# Patient Record
Sex: Female | Born: 1970 | Race: Black or African American | Hispanic: No | Marital: Married | Smoking: Never smoker
Health system: Southern US, Community
[De-identification: ages and names within clinical notes are randomized; demographics above are authoritative.]

## PROBLEM LIST (undated history)

## (undated) DIAGNOSIS — E669 Obesity, unspecified: Secondary | ICD-10-CM

## (undated) HISTORY — PX: DILATION AND CURETTAGE OF UTERUS: SHX78

## (undated) HISTORY — PX: APPENDECTOMY: SHX54

## (undated) HISTORY — PX: ABDOMINAL HYSTERECTOMY: SHX81

---

## 2002-01-14 ENCOUNTER — Encounter: Payer: Self-pay | Admitting: *Deleted

## 2002-01-14 ENCOUNTER — Inpatient Hospital Stay (HOSPITAL_COMMUNITY): Admission: AD | Admit: 2002-01-14 | Discharge: 2002-01-26 | Payer: Self-pay | Admitting: *Deleted

## 2002-02-10 ENCOUNTER — Inpatient Hospital Stay (HOSPITAL_COMMUNITY): Admission: AD | Admit: 2002-02-10 | Discharge: 2002-02-10 | Payer: Self-pay | Admitting: *Deleted

## 2002-03-10 ENCOUNTER — Inpatient Hospital Stay (HOSPITAL_COMMUNITY): Admission: AD | Admit: 2002-03-10 | Discharge: 2002-03-10 | Payer: Self-pay | Admitting: *Deleted

## 2011-05-14 ENCOUNTER — Ambulatory Visit: Payer: Self-pay | Admitting: Internal Medicine

## 2011-05-14 ENCOUNTER — Observation Stay: Payer: Self-pay | Admitting: Surgery

## 2011-05-20 LAB — PATHOLOGY REPORT

## 2011-12-10 IMAGING — CR DG ABDOMEN 2V
1 series · 2 of 2 positions shown · non-contrast
Comparison: none

REASON FOR EXAM: Abdominal pain for 3 days.
COMMENTS:

PROCEDURE:     MDR - MDR ABDOMEN 2V FLAT AND ERECT  - May 14, 2011 [DATE]
RESULT:     View the abdomen demonstrates an unremarkable bowel gas pattern.
The lung bases are clear. The bony structures are unremarkable.

[Series 1: view not recorded · 0.17mm/px · 2 of 2 slices shown]
[im 1/2]
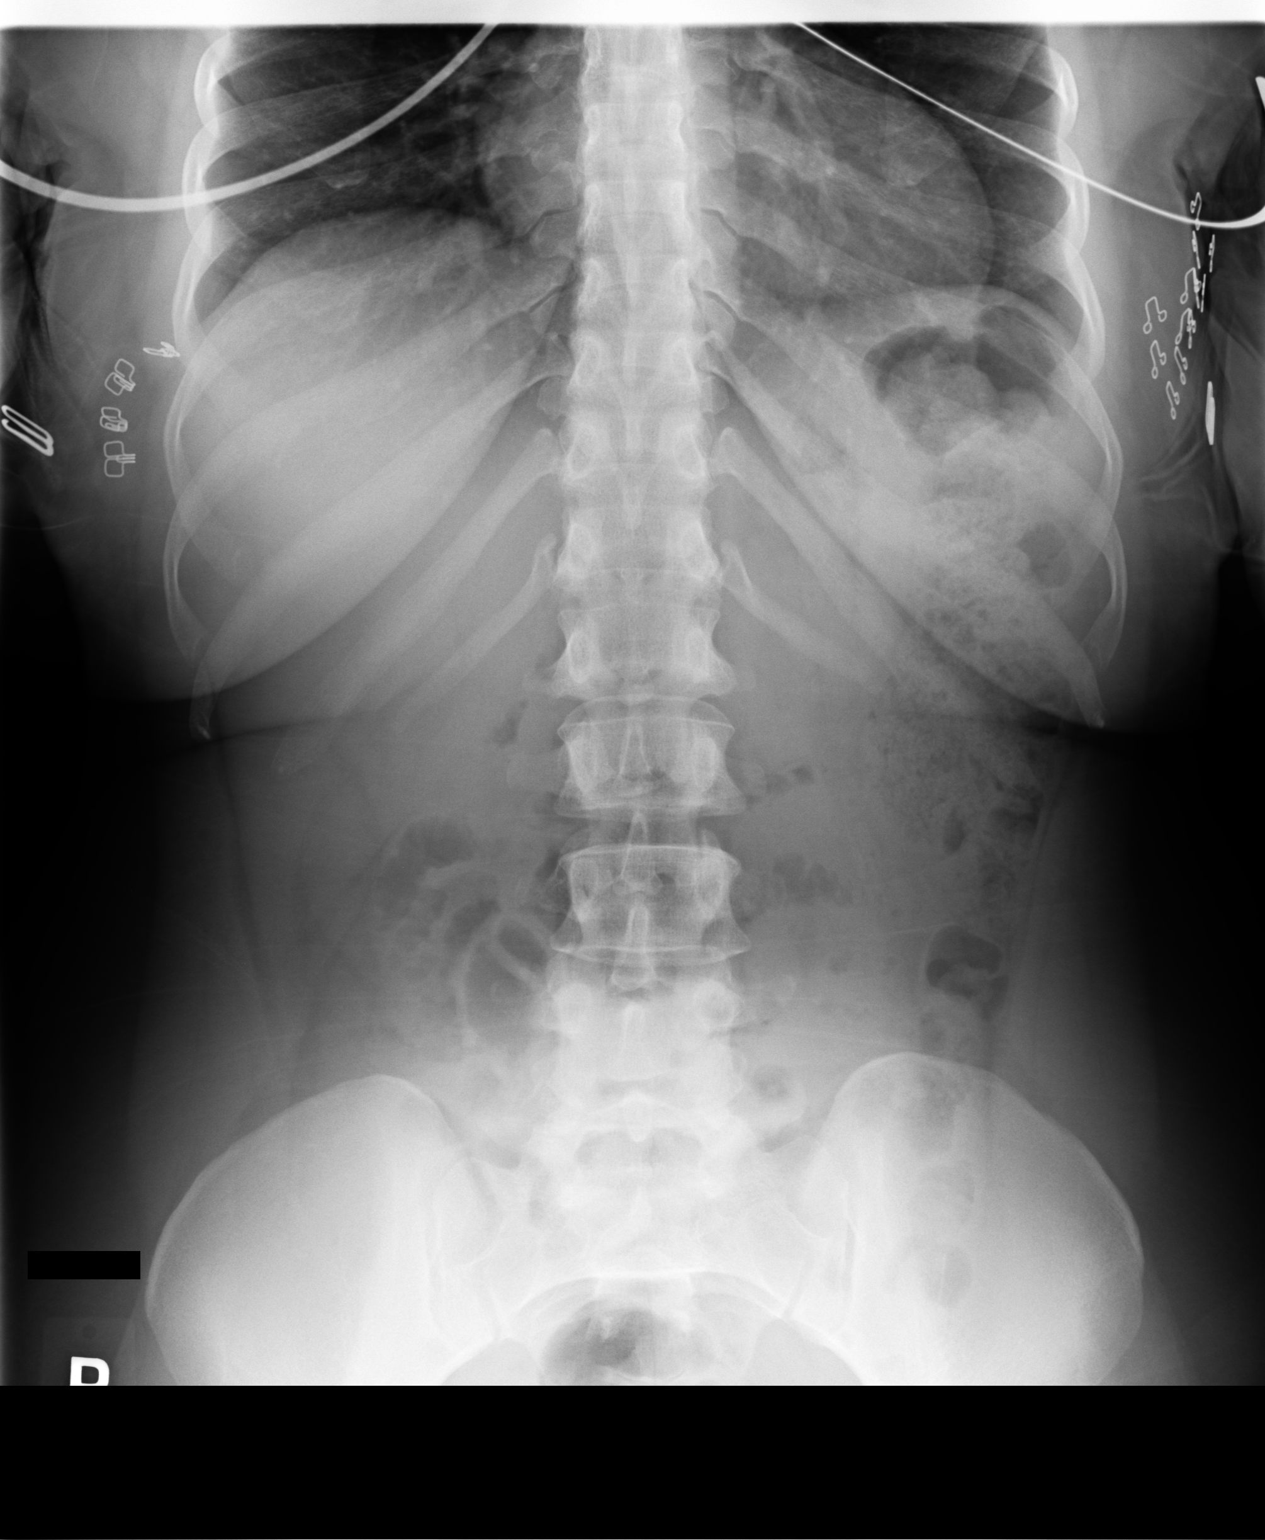
[im 2/2]
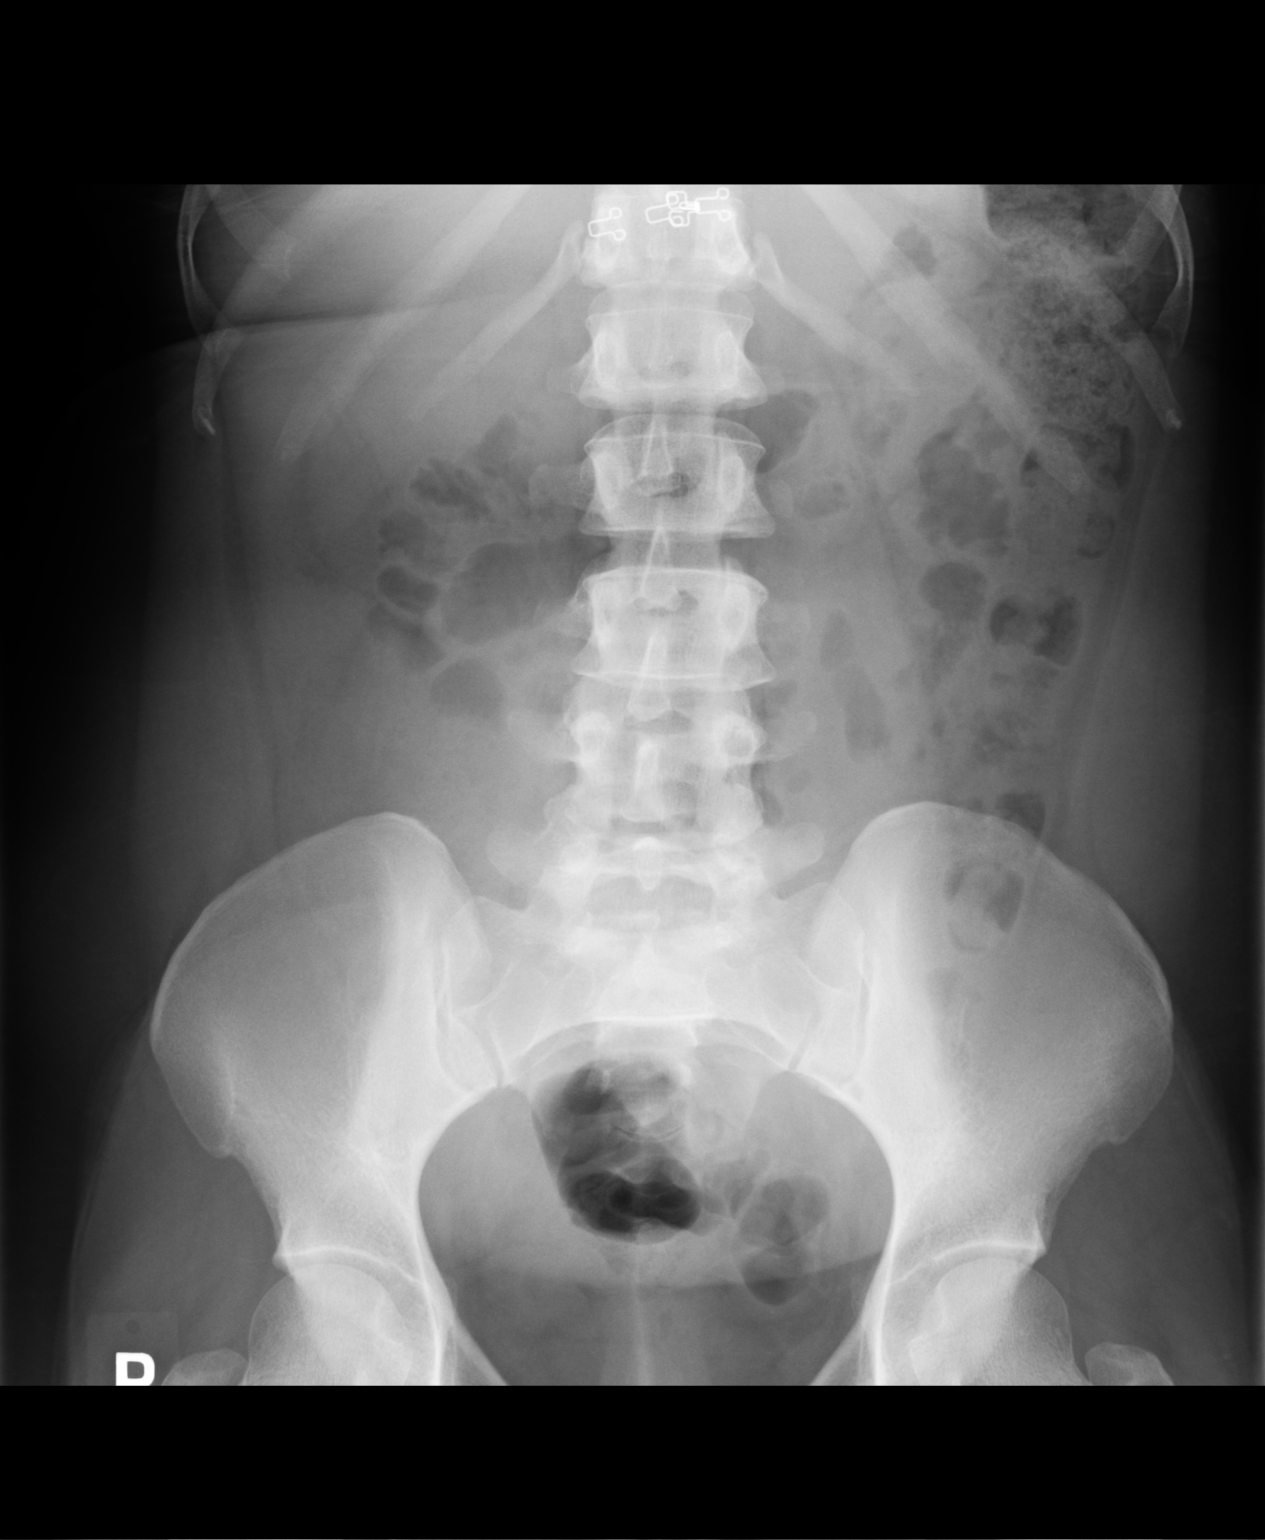

[2 of 2 positions shown; findings below may reference images not displayed]

IMPRESSION: No evidence of bowel obstruction or perforation.

## 2014-01-29 ENCOUNTER — Ambulatory Visit: Payer: Self-pay | Admitting: Family Medicine

## 2014-01-29 LAB — PREGNANCY, URINE: Pregnancy Test, Urine: NEGATIVE m[IU]/mL

## 2014-08-27 IMAGING — CR DG ABDOMEN 3V
1 series · 3 of 3 positions shown · non-contrast
Comparison: CT abdomen pelvis of 05/14/2011 and abdomen plain films
of the same day

CLINICAL DATA: Severe paraumbilical pain over the last 3 weeks,
history of prior appendectomy

EXAM:
ABDOMEN SERIES

[Series 1: pa · 0.17mm/px · 3 of 3 slices shown]
[im 1/3]
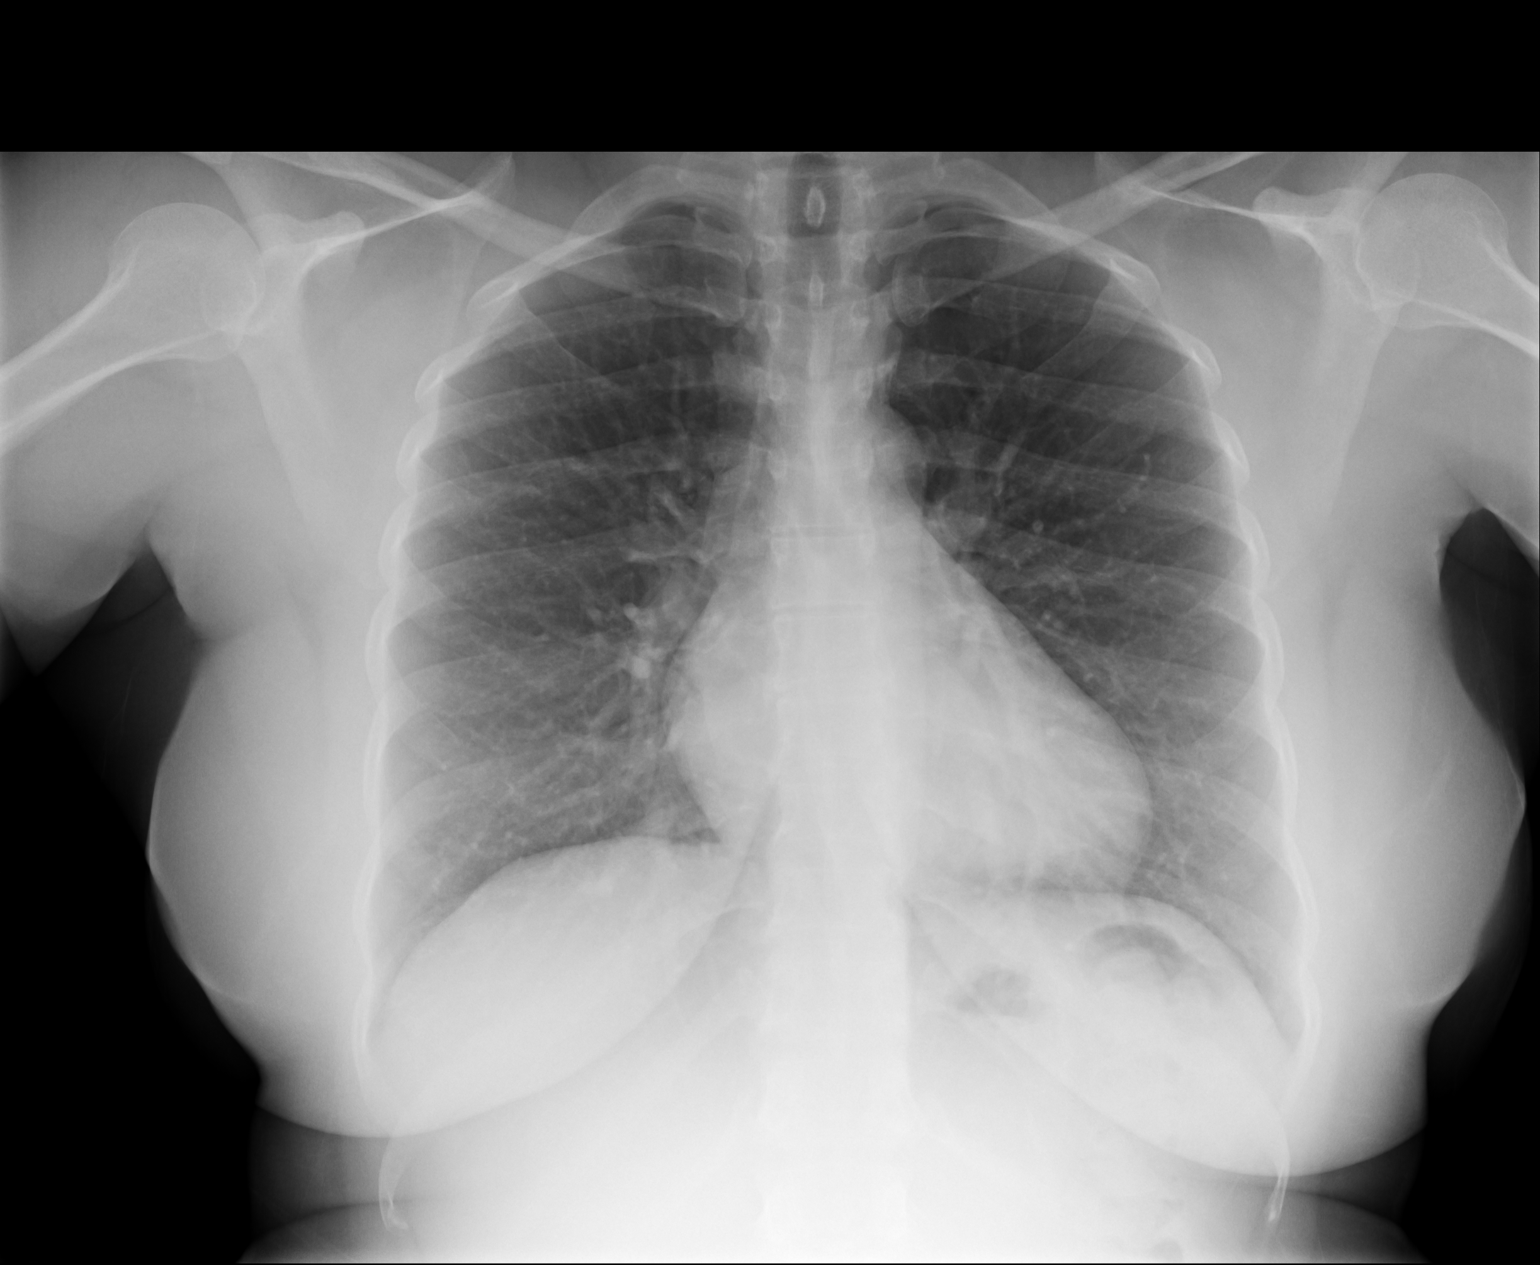
[im 2/3]
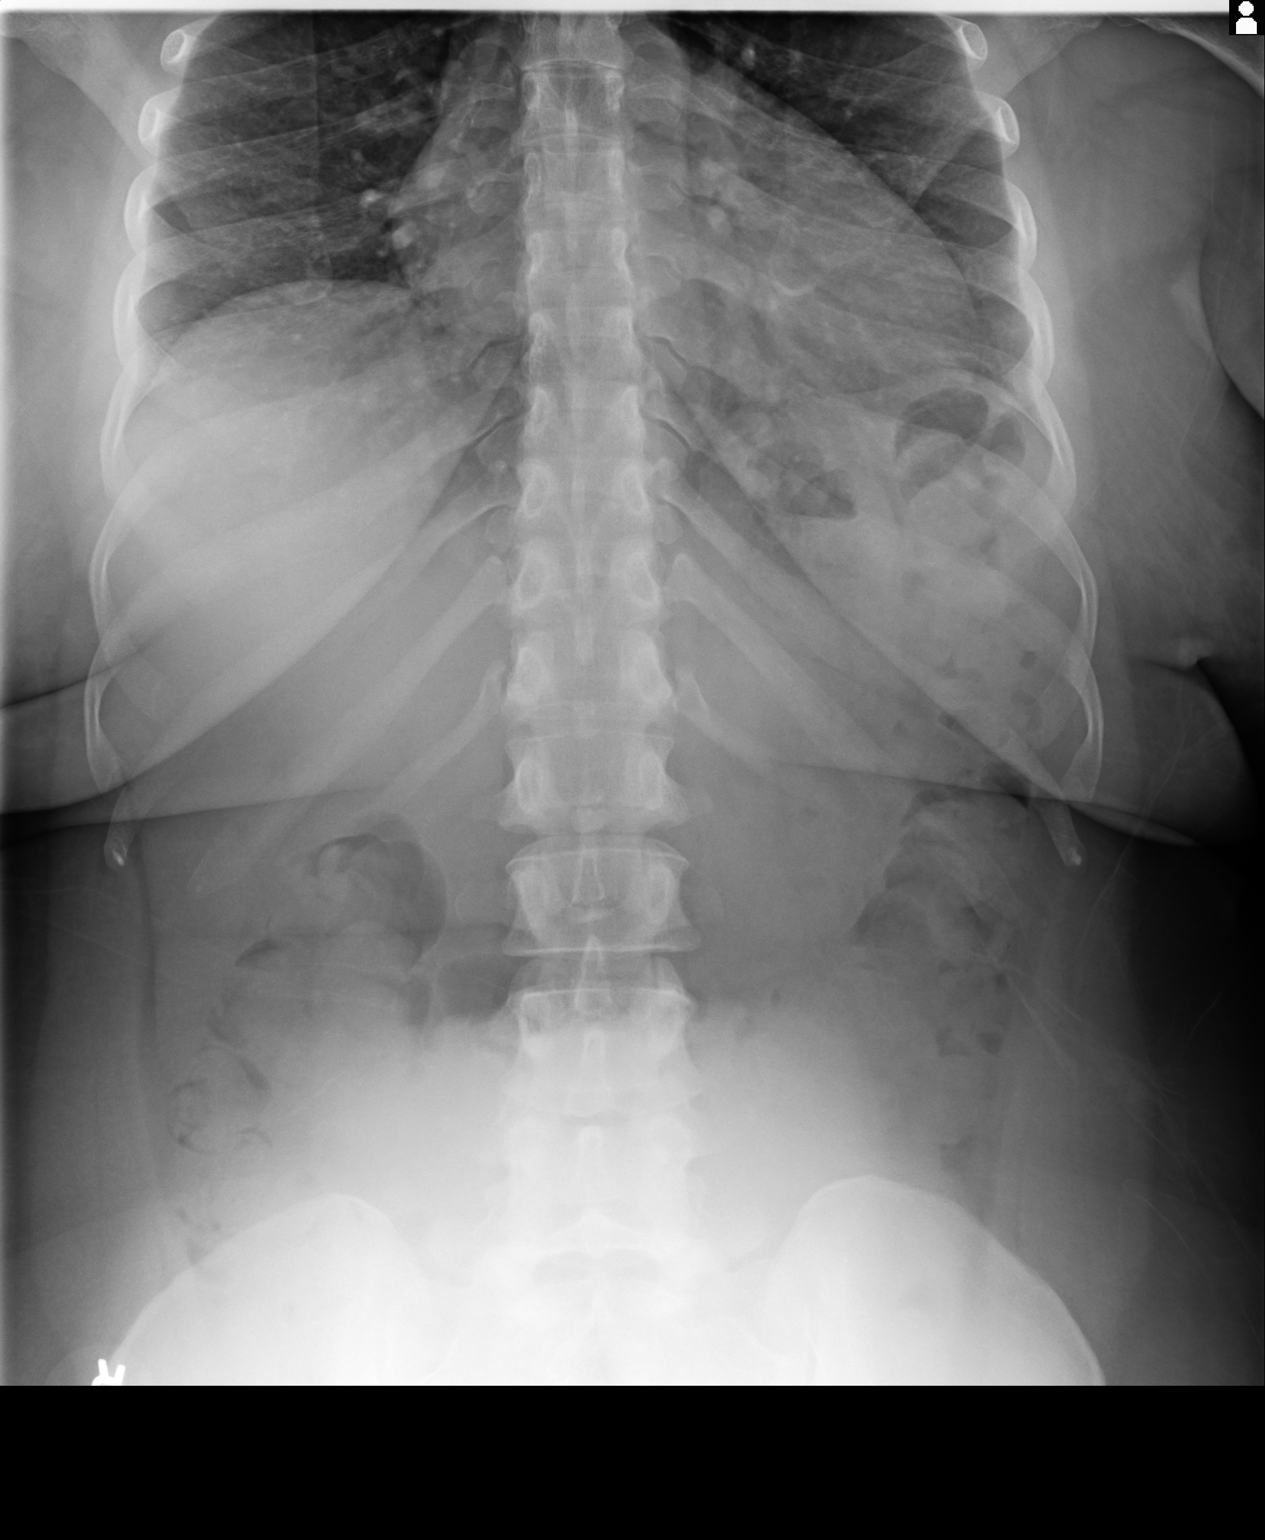
[im 3/3]
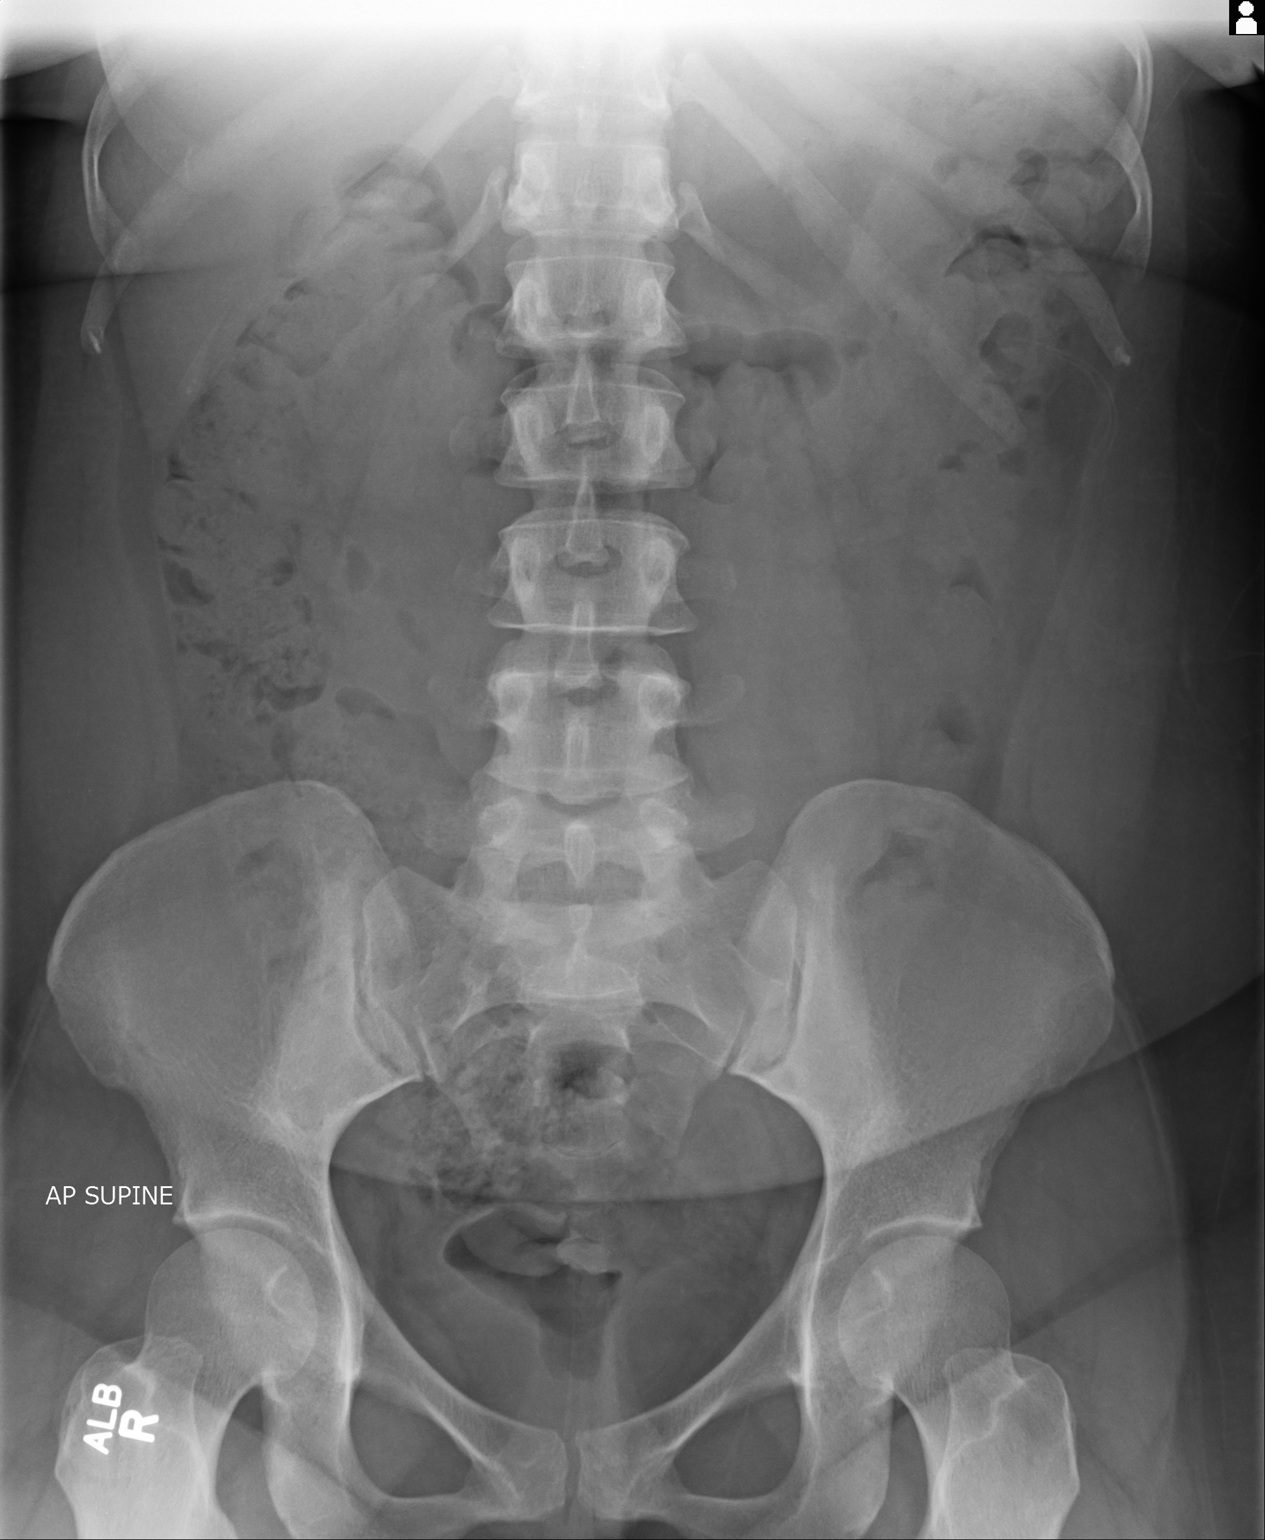

[3 of 3 positions shown; findings below may reference images not displayed]

FINDINGS: No active infiltrate or effusion is seen. Mediastinal contours
appear normal. The heart is within normal limits in size. No bony
abnormality is seen.

Supine and erect views of the abdomen show a moderate amount of
feces throughout the colon. No bowel obstruction is seen. No free
air is noted. No opaque calculi are seen.
IMPRESSION: 1. No active lung disease.
2. Moderate amount of feces throughout the colon. No bowel
obstruction or free air.

## 2017-01-16 ENCOUNTER — Ambulatory Visit
Admission: EM | Admit: 2017-01-16 | Discharge: 2017-01-16 | Disposition: A | Discharge status: Home / Self Care | Payer: Managed Care, Other (non HMO) | Attending: Family Medicine | Admitting: Family Medicine

## 2017-01-16 ENCOUNTER — Encounter: Payer: Self-pay | Admitting: Gynecology

## 2017-01-16 DIAGNOSIS — G44209 Tension-type headache, unspecified, not intractable: Secondary | ICD-10-CM

## 2017-01-16 DIAGNOSIS — M7630 Iliotibial band syndrome, unspecified leg: Secondary | ICD-10-CM

## 2017-01-16 HISTORY — DX: Obesity, unspecified: E66.9

## 2017-01-16 NOTE — ED Triage Notes (Signed)
Per patient c/o bilateral leg pain x 5 days ago. Per patient no injury to legs. Patient also c/o headache x yesterday.

## 2017-04-19 NOTE — ED Provider Notes (Signed)
[facility]    CSN: [id number] Arrival date & time: 01/16/17  1214     History   Chief Complaint Chief Complaint  Patient presents with  . Leg Pain  . Headache    HPI Evelyn Brandt is a 46 y.o. female.   46 yo female with a c/o bilateral leg pain x 5 days ago. States pain is on the side of the legs. Denies any injuries, trauma, fevers, chills, swelling, redness. Worse with going up steps. Patient also c/o headache since yesterday. Denies any vision changes, photophobia, nausea, vomiting, fevers.    The history is provided by the patient.  Leg Pain  Location:  Leg Headache    Past Medical History:  Diagnosis Date  . Obesity     There are no active problems to display for this patient.   Past Surgical History:  Procedure Laterality Date  . ABDOMINAL HYSTERECTOMY    . APPENDECTOMY    . DILATION AND CURETTAGE OF UTERUS      OB History    No data available       Home Medications    Prior to Admission medications   Medication Sig Start Date End Date Taking? Authorizing Provider  phentermine (ADIPEX-P) 37.5 MG tablet Take 37.5 mg by mouth daily before breakfast.   Yes [provider]    Family History No family history on file.  Social History Social History  Substance Use Topics  . Smoking status: Never Smoker  . Smokeless tobacco: Never Used  . Alcohol use No     Allergies   Hydrocodone-acetaminophen   Review of Systems Review of Systems  Neurological: Positive for headaches.     Physical Exam Triage Vital Signs ED Triage Vitals  Enc Vitals Group     BP 01/16/17 1300 134/85     Pulse Rate 01/16/17 1300 84     Resp 01/16/17 1300 16     Temp 01/16/17 1300 97.8 F (36.6 C)     Temp Source 01/16/17 1300 Oral     SpO2 01/16/17 1300 100 %     Weight 01/16/17 1301 172 lb (78 kg)     Height 01/16/17 1301 4\' 11"  (1.499 m)     Head Circumference --      Peak Flow --      Pain Score 01/16/17 1304 5     Pain Loc --    Pain Edu? --      Excl. in GC? --    No data found.   Updated Vital Signs BP 134/85 (BP Location: Left Arm)   Pulse 84   Temp 97.8 F (36.6 C) (Oral)   Resp 16   Ht 4\' 11"  (1.499 m)   Wt 172 lb (78 kg)   SpO2 100%   BMI 34.74 kg/m   Visual Acuity Right Eye Distance:   Left Eye Distance:   Bilateral Distance:    Right Eye Near:   Left Eye Near:    Bilateral Near:     Physical Exam  Constitutional: She appears well-developed and well-nourished. No distress.  HENT:  Head: Normocephalic and atraumatic.  Right Ear: Tympanic membrane, external ear and ear canal normal.  Left Ear: Tympanic membrane, external ear and ear canal normal.  Nose: No mucosal edema, rhinorrhea, nose lacerations, sinus tenderness, nasal deformity, septal deviation or nasal septal hematoma. No epistaxis.  No foreign bodies. Right sinus exhibits no maxillary sinus tenderness and no frontal sinus tenderness. Left sinus exhibits no maxillary sinus tenderness  and no frontal sinus tenderness.  Mouth/Throat: Uvula is midline, oropharynx is clear and moist and mucous membranes are normal. No oropharyngeal exudate.  Eyes: Conjunctivae and EOM are normal. Pupils are equal, round, and reactive to light. Right eye exhibits no discharge. Left eye exhibits no discharge. No scleral icterus.  Neck: Normal range of motion. Neck supple. No thyromegaly present.  Cardiovascular: Normal rate, regular rhythm and normal heart sounds.   Pulmonary/Chest: Effort normal and breath sounds normal. No respiratory distress. She has no wheezes. She has no rales.  Musculoskeletal:       Right hip: Normal.       Left hip: Normal.       Right knee: Normal.       Left knee: Normal.  Tenderness to palpation over the IT band bilaterally  Lymphadenopathy:    She has no cervical adenopathy.  Skin: She is not diaphoretic.  Nursing note and vitals reviewed.    UC Treatments / Results  Labs (all labs ordered are listed, but only abnormal  results are displayed) Labs Reviewed - No data to display  EKG  EKG Interpretation None       Radiology No results found.  Procedures Procedures (including critical care time)  Medications Ordered in UC Medications - No data to display   Initial Impression / Assessment and Plan / UC Course  I have reviewed the triage vital signs and the nursing notes.  Pertinent labs & imaging results that were available during my care of the patient were reviewed by me and considered in my medical decision making (see chart for details).      Final Clinical Impressions(s) / UC Diagnoses   Final diagnoses:  Tension headache  Iliotibial band syndrome, unspecified laterality    New Prescriptions Discharge Medication List as of 01/16/2017  3:12 PM     1. diagnosis reviewed with patient 2. Recommend supportive treatment with otc analgesics, stretches  3. Follow-up prn if symptoms worsen or don't improve   Payton Mccallumonty, Nilesh Stegall, MD 04/19/17 1726

## 2023-05-15 ENCOUNTER — Ambulatory Visit
Admission: EM | Admit: 2023-05-15 | Discharge: 2023-05-15 | Disposition: A | Discharge status: Home / Self Care | Payer: Commercial Managed Care - PPO | Attending: Internal Medicine | Admitting: Internal Medicine

## 2023-05-15 DIAGNOSIS — J0110 Acute frontal sinusitis, unspecified: Secondary | ICD-10-CM | POA: Diagnosis not present

## 2023-05-15 MED ORDER — AMOXICILLIN-POT CLAVULANATE 875-125 MG PO TABS: 1.0000 | ORAL_TABLET | Freq: Two times a day (BID) | ORAL | 0 refills | Status: AC

## 2023-05-15 NOTE — Discharge Instructions (Signed)
You were seen today for sinus symptoms.  We are treating you with antibiotics twice daily for the next 10 days.  I would also recommend that you do Zyrtec and Flonase daily x 1 week which can be purchased over-the-counter.  Please follow-up if your symptoms persist or worsen.

## 2023-05-15 NOTE — ED Provider Notes (Signed)
[facility]    CSN: [id number] Arrival date & time: 05/15/23  0903      History   Chief Complaint Chief Complaint  Patient presents with   Sore Throat   Cough    HPI Evelyn Brandt is a 52 y.o. female presents to urgent care today with complaint of headache, left-sided facial pressure, bilateral ear pain, nasal congestion, sore throat, hoarseness and cough.  She reports this started 1 week ago.  She describes the headache as pressure.  She is not able to blow anything out of her nose.  She did have some difficulty swallowing in the beginning but that has improved.  The cough is mostly nonproductive.  She denies runny nose, shortness of breath, nausea, vomiting or diarrhea.  She denies fever, chills or body aches but does feel extremely fatigued.  She has not had sick contacts that she is aware of.  She reports she is prone to sinus infections and this feels similar.  She has tried Advil sinus with minimal relief of symptoms.  HPI  Past Medical History:  Diagnosis Date   Obesity     There are no problems to display for this patient.   Past Surgical History:  Procedure Laterality Date   ABDOMINAL HYSTERECTOMY     APPENDECTOMY     DILATION AND CURETTAGE OF UTERUS      OB History   No obstetric history on file.      Home Medications    Prior to Admission medications   Medication Sig Start Date End Date Taking? Authorizing Provider  amoxicillin-clavulanate (AUGMENTIN) 875-125 MG tablet Take 1 tablet by mouth 2 (two) times daily. 05/15/23  Yes Boyde Grieco, Salvadore Oxford, NP  phentermine (ADIPEX-P) 37.5 MG tablet Take 37.5 mg by mouth daily before breakfast.    [provider]    Family History History reviewed. No pertinent family history.  Social History Social History   Tobacco Use   Smoking status: Never   Smokeless tobacco: Never  Vaping Use   Vaping Use: Former  Substance Use Topics   Alcohol use: No   Drug use: Never     Allergies    Hydrocodone-acetaminophen   Review of Systems Review of Systems  Constitutional:  Positive for fatigue. Negative for chills and fever.  HENT:  Positive for congestion, ear pain, postnasal drip, sinus pressure, sinus pain and sore throat. Negative for ear discharge and rhinorrhea.   Eyes:  Negative for pain and redness.  Respiratory:  Positive for cough. Negative for shortness of breath.   Cardiovascular:  Negative for chest pain.  Gastrointestinal:  Negative for diarrhea, nausea and vomiting.  Musculoskeletal:  Negative for arthralgias and myalgias.  Skin:  Negative for rash.  Neurological:  Positive for headaches. Negative for dizziness, weakness and light-headedness.     Physical Exam Triage Vital Signs ED Triage Vitals  Enc Vitals Group     BP 05/15/23 0928 118/85     Pulse Rate 05/15/23 0928 90     Resp 05/15/23 0928 18     Temp 05/15/23 0928 98.7 F (37.1 C)     Temp Source 05/15/23 0928 Oral     SpO2 05/15/23 0928 100 %     Weight 05/15/23 0928 160 lb (72.6 kg)     Height 05/15/23 0928 4\' 11"  (1.499 m)     Head Circumference --      Peak Flow --      Pain Score 05/15/23 0927 7     Pain  Loc --      Pain Edu? --      Excl. in GC? --    No data found.  Updated Vital Signs BP 118/85 (BP Location: Left Arm)   Pulse 90   Temp 98.7 F (37.1 C) (Oral)   Resp 18   Ht 4\' 11"  (1.499 m)   Wt 160 lb (72.6 kg)   SpO2 100%   BMI 32.32 kg/m      Physical Exam Constitutional:      Appearance: She is ill-appearing.  HENT:     Head: Normocephalic.     Comments: Left frontal sinus pressure     Right Ear: Tympanic membrane and ear canal normal. No middle ear effusion.     Left Ear: Tympanic membrane and ear canal normal.  No middle ear effusion.     Nose: Congestion present.     Mouth/Throat:     Mouth: Mucous membranes are moist.     Pharynx: Posterior oropharyngeal erythema present. No oropharyngeal exudate.  Eyes:     Conjunctiva/sclera: Conjunctivae normal.      Pupils: Pupils are equal, round, and reactive to light.  Cardiovascular:     Rate and Rhythm: Normal rate and regular rhythm.     Heart sounds: Normal heart sounds.  Pulmonary:     Effort: Pulmonary effort is normal.     Breath sounds: Normal breath sounds.  Lymphadenopathy:     Cervical: No cervical adenopathy.  Skin:    General: Skin is warm and dry.  Neurological:     Mental Status: She is alert and oriented to person, place, and time.      UC Treatments / Results  Labs   EKG   Radiology     Medications Ordered in UC Medications - No data to display  Initial Impression / Assessment and Plan / UC Course  I have reviewed the triage vital signs and the nursing notes.  Pertinent labs & imaging results that were available during my care of the patient were reviewed by me and considered in my medical decision making (see chart for details).    52 year old female with 1 week history of sinus symptoms.  She is prone to sinus infection.  Discussed that she is on the verge of a viral/bacterial infection but given how bad she feels, will treat with Augmentin 875-125 mg twice daily x 10 days.  Advised her to take Zyrtec and Flonase OTC x 1 week.  Return precautions discussed. Final Clinical Impressions(s) / UC Diagnoses   Final diagnoses:  Acute non-recurrent frontal sinusitis     Discharge Instructions      You were seen today for sinus symptoms.  We are treating you with antibiotics twice daily for the next 10 days.  I would also recommend that you do Zyrtec and Flonase daily x 1 week which can be purchased over-the-counter.  Please follow-up if your symptoms persist or worsen.     ED Prescriptions     Medication Sig Dispense Auth. Provider   amoxicillin-clavulanate (AUGMENTIN) 875-125 MG tablet Take 1 tablet by mouth 2 (two) times daily. 20 tablet Lorre Munroe, NP      PDMP not reviewed this encounter.   Lorre Munroe, NP 05/15/23 [phone]

## 2023-05-15 NOTE — ED Triage Notes (Signed)
Sore throat; cough; headache; ear pain x 4days. Dry cough. No known sick exposure.   Patient tried Mucinex, tylenol cold and sinus with slight relief. No fevers.
